# Patient Record
Sex: Male | Born: 1995 | Race: Black or African American | Hispanic: No | Marital: Single | Smoking: Current every day smoker
Health system: Southern US, Community
[De-identification: ages and names within clinical notes are randomized; demographics above are authoritative.]

---

## 2019-01-08 ENCOUNTER — Encounter (HOSPITAL_BASED_OUTPATIENT_CLINIC_OR_DEPARTMENT_OTHER): Payer: Self-pay | Admitting: *Deleted

## 2019-01-08 ENCOUNTER — Other Ambulatory Visit: Payer: Self-pay

## 2019-01-08 ENCOUNTER — Emergency Department (HOSPITAL_BASED_OUTPATIENT_CLINIC_OR_DEPARTMENT_OTHER)
Admission: EM | Admit: 2019-01-08 | Discharge: 2019-01-08 | Disposition: A | Discharge status: Home / Self Care | Payer: Self-pay | Attending: Emergency Medicine | Admitting: Emergency Medicine

## 2019-01-08 DIAGNOSIS — J029 Acute pharyngitis, unspecified: Secondary | ICD-10-CM | POA: Insufficient documentation

## 2019-01-08 DIAGNOSIS — T2125XA Burn of second degree of buttock, initial encounter: Secondary | ICD-10-CM | POA: Insufficient documentation

## 2019-01-08 DIAGNOSIS — Y929 Unspecified place or not applicable: Secondary | ICD-10-CM | POA: Insufficient documentation

## 2019-01-08 DIAGNOSIS — Z20828 Contact with and (suspected) exposure to other viral communicable diseases: Secondary | ICD-10-CM | POA: Insufficient documentation

## 2019-01-08 DIAGNOSIS — Y990 Civilian activity done for income or pay: Secondary | ICD-10-CM | POA: Insufficient documentation

## 2019-01-08 DIAGNOSIS — X19XXXA Contact with other heat and hot substances, initial encounter: Secondary | ICD-10-CM | POA: Insufficient documentation

## 2019-01-08 DIAGNOSIS — Y939 Activity, unspecified: Secondary | ICD-10-CM | POA: Insufficient documentation

## 2019-01-08 LAB — GROUP A STREP BY PCR: Group A Strep by PCR: NOT DETECTED

## 2019-01-08 MED ORDER — BENZONATATE 100 MG PO CAPS: 100.0000 mg | ORAL_CAPSULE | Freq: Three times a day (TID) | ORAL | 0 refills | Status: AC

## 2019-01-08 MED ORDER — SILVER SULFADIAZINE 1 % EX CREA
TOPICAL_CREAM | CUTANEOUS | Status: AC
  Administered 2019-01-08: 22:00:00 via TOPICAL
  Filled 2019-01-08: qty 85

## 2019-01-08 MED ORDER — SILVER SULFADIAZINE 1 % EX CREA
TOPICAL_CREAM | Freq: Once | CUTANEOUS | Status: AC
  Administered 2019-01-08: 22:00:00 via TOPICAL
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
## 2019-01-08 NOTE — Discharge Instructions (Addendum)
The strep test was negative.  Your symptoms are likely consistent with a viral illness. Viruses do not require or respond to antibiotics. Treatment is symptomatic care and it is important to note that these symptoms may last for 7-14 days.   Hand washing: Wash your hands throughout the day, but especially before and after touching the face, using the restroom, sneezing, coughing, or touching surfaces that have been coughed or sneezed upon. Hydration: Symptoms of most illnesses will be intensified and complicated by dehydration. Dehydration can also extend the duration of symptoms. Drink plenty of fluids and get plenty of rest. You should be drinking at least half a liter of water an hour to stay hydrated. Electrolyte drinks (ex. Gatorade, Powerade, Pedialyte) are also encouraged. You should be drinking enough fluids to make your urine light yellow, almost clear. If this is not the case, you are not drinking enough water. Please note that some of the treatments indicated below will not be effective if you are not adequately hydrated. Diet: Please concentrate on hydration, however, you may introduce food slowly.  Start with a clear liquid diet, progressed to a full liquid diet, and then bland solids as you are able. Pain or fever: Ibuprofen, Naproxen, or acetaminophen (generic for Tylenol) for pain or fever.  Antiinflammatory medications: Take 600 mg of ibuprofen every 6 hours or 440 mg (over the counter dose) to 500 mg (prescription dose) of naproxen every 12 hours for the next 3 days. After this time, these medications may be used as needed for pain. Take these medications with food to avoid upset stomach. Choose only one of these medications, do not take them together. Acetaminophen (generic for Tylenol): Should you continue to have additional pain while taking the ibuprofen or naproxen, you may add in acetaminophen as needed. Your daily total maximum amount of acetaminophen from all sources should be  limited to 4000mg /day for persons without liver problems, or 2000mg /day for those with liver problems. Cough: Use the benzonatate (generic for Tessalon) for cough.  Teas, warm liquids, broths, and honey can also help with cough. Zyrtec or Claritin: May add these medication daily to control underlying symptoms of congestion, sneezing, and other signs of allergies.  These medications are available over-the-counter. Generics: Cetirizine (generic for Zyrtec) and loratadine (generic for Claritin). Fluticasone: Use fluticasone (generic for Flonase), as directed, for nasal and sinus congestion.  This medication is available over-the-counter. Congestion: Plain guaifenesin (generic for plain Mucinex) may help relieve congestion. Saline sinus rinses and saline nasal sprays may also help relieve congestion. If you do not have high blood pressure, heart problems, or an allergy to such medications, you may also try phenylephrine or Sudafed. Sore throat: Warm liquids or Chloraseptic spray may help soothe a sore throat. Gargle twice a day with a salt water solution made from a half teaspoon of salt in a cup of warm water.  Follow up: Follow up with a primary care provider within the next two weeks should symptoms fail to resolve. Return: Return to the ED for significantly worsening symptoms, shortness of breath, persistent vomiting, large amounts of blood in stool, or any other major concerns.  For prescription assistance, may try using prescription discount sites or apps, such as goodrx.com  You have a test pending for COVID-19.  Results typically return within about 48 hours.  Be sure to check MyChart for updated results.  We recommend isolating yourself until results are received.  Patients who have symptoms consistent with COVID-19 should self isolated for: At  least 3 days (72 hours) have passed since recovery, defined as resolution of fever without the use of fever reducing medications and improvement in  respiratory symptoms (e.g., cough, shortness of breath), and At least 7 days have passed since symptoms first appeared.  If you have no symptoms, but your test returns positive, recommend isolating for at least 10 days.    Wound Care - General Wound Cleaning: Clean the wound and surrounding area gently with tap water and mild soap. Rinse well and blot dry. Do not scrub the wound, as this may cause the wound edges to come apart. You may shower, but avoid submerging the wound, such as with a bath or swimming.  Clean the wound daily to prevent infection.  Do not use cleaners such as hydrogen peroxide or alcohol.   Silvadene cream: Apply the cream twice daily for about the next 5 days.  This cream is used to help prevent infection and to encourage healing.  Scar reduction: Application of a topical antibiotic ointment, such as Neosporin, after the wound has begun to close and heal well can decrease scab formation and reduce scarring. After the wound has healed, application of ointments such as Aquaphor can also reduce scar formation.  The key to scar reduction is keeping the skin well hydrated and supple. Drinking plenty of water throughout the day (At least eight 8oz glasses of water a day) is essential to staying well hydrated.  Sun exposure: Keep the wound out of the sun. After the wound has healed, continue to protect it from the sun by wearing protective clothing or applying sunscreen.  Pain: You may use Tylenol, naproxen, or ibuprofen for pain. Antiinflammatory medications: Take 600 mg of ibuprofen every 6 hours or 440 mg (over the counter dose) to 500 mg (prescription dose) of naproxen every 12 hours for the next 3 days. After this time, these medications may be used as needed for pain. Take these medications with food to avoid upset stomach. Choose only one of these medications, do not take them together. Acetaminophen (generic for Tylenol): Should you continue to have additional pain while  taking the ibuprofen or naproxen, you may add in acetaminophen as needed. Your daily total maximum amount of acetaminophen from all sources should be limited to 4000mg /day for persons without liver problems, or 2000mg /day for those with liver problems.  Return: Return to the ED should signs of infection arise, such as spreading redness, puffiness/swelling, pus draining from the wound, severe increase in pain, fever over 100.30F, or any other major issues.  For prescription assistance, may try using prescription discount sites or apps, such as goodrx.com

## 2019-01-08 NOTE — ED Triage Notes (Signed)
Pt c/o sore throat x 1 day

## 2019-01-10 LAB — NOVEL CORONAVIRUS, NAA (HOSP ORDER, SEND-OUT TO REF LAB; TAT 18-24 HRS): SARS-CoV-2, NAA: NOT DETECTED

## 2019-05-29 ENCOUNTER — Other Ambulatory Visit: Payer: Self-pay

## 2019-05-29 ENCOUNTER — Emergency Department (HOSPITAL_COMMUNITY)
Admission: EM | Admit: 2019-05-29 | Discharge: 2019-05-29 | Discharge status: Against Medical Advice | Payer: Medicaid Other | Attending: Emergency Medicine | Admitting: Emergency Medicine

## 2019-05-29 ENCOUNTER — Encounter (HOSPITAL_COMMUNITY): Payer: Self-pay | Admitting: Emergency Medicine

## 2019-05-29 DIAGNOSIS — M25561 Pain in right knee: Secondary | ICD-10-CM

## 2019-05-29 DIAGNOSIS — Z5320 Procedure and treatment not carried out because of patient's decision for unspecified reasons: Secondary | ICD-10-CM | POA: Insufficient documentation

## 2019-05-29 DIAGNOSIS — R509 Fever, unspecified: Secondary | ICD-10-CM | POA: Insufficient documentation

## 2019-05-29 MED ORDER — IBUPROFEN 800 MG PO TABS: 800.0000 mg | ORAL_TABLET | Freq: Once | ORAL | Status: DC
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
## 2019-05-29 NOTE — ED Notes (Signed)
Patient states that his employer called him to tell him that he is not supposed to be at the ER, but instead to go to [facility].  PT says he will be leaving to go to [facility].  PT is on the phone making arrangement to leave
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
## 2019-05-29 NOTE — Discharge Instructions (Addendum)
Recommend work up for fever with right knee pain as follows- CBC, BMP, UA, COVID test, XR knee. You have decided to leave against medical advice to obtain care at an urgent care. Recommend return to ER for further evaluation if needed, contact your employer for further assistance.

## 2019-06-03 ENCOUNTER — Emergency Department (HOSPITAL_COMMUNITY)
Admission: EM | Admit: 2019-06-03 | Discharge: 2019-06-03 | Disposition: A | Discharge status: Home / Self Care | Payer: No Typology Code available for payment source | Attending: Emergency Medicine | Admitting: Emergency Medicine

## 2019-06-03 ENCOUNTER — Emergency Department (HOSPITAL_COMMUNITY): Payer: No Typology Code available for payment source

## 2019-06-03 ENCOUNTER — Other Ambulatory Visit: Payer: Self-pay

## 2019-06-03 ENCOUNTER — Encounter (HOSPITAL_COMMUNITY): Payer: Self-pay | Admitting: Emergency Medicine

## 2019-06-03 DIAGNOSIS — S40011A Contusion of right shoulder, initial encounter: Secondary | ICD-10-CM | POA: Diagnosis not present

## 2019-06-03 DIAGNOSIS — T148XXA Other injury of unspecified body region, initial encounter: Secondary | ICD-10-CM

## 2019-06-03 DIAGNOSIS — S46819A Strain of other muscles, fascia and tendons at shoulder and upper arm level, unspecified arm, initial encounter: Secondary | ICD-10-CM | POA: Diagnosis not present

## 2019-06-03 DIAGNOSIS — Y92410 Unspecified street and highway as the place of occurrence of the external cause: Secondary | ICD-10-CM | POA: Insufficient documentation

## 2019-06-03 DIAGNOSIS — Y939 Activity, unspecified: Secondary | ICD-10-CM | POA: Diagnosis not present

## 2019-06-03 DIAGNOSIS — Y999 Unspecified external cause status: Secondary | ICD-10-CM | POA: Diagnosis not present

## 2019-06-03 DIAGNOSIS — S0081XA Abrasion of other part of head, initial encounter: Secondary | ICD-10-CM | POA: Diagnosis not present

## 2019-06-03 DIAGNOSIS — S4991XA Unspecified injury of right shoulder and upper arm, initial encounter: Secondary | ICD-10-CM | POA: Diagnosis present

## 2019-06-03 MED ORDER — IBUPROFEN 400 MG PO TABS
800.0000 mg | ORAL_TABLET | Freq: Once | ORAL | Status: AC
  Administered 2019-06-03: 15:00:00 800 mg via ORAL
  Filled 2019-06-03: qty 2

## 2019-06-03 NOTE — ED Notes (Signed)
Patient awake alert, return from xray,color pink,chest clear,good aeration,no retractions 3 plus pulses ,<2sec refill,tolerated po med, observing/awaiting xray results, provider at bedside

## 2019-06-03 NOTE — Discharge Instructions (Addendum)
Use Tylenol and Motrin and ice as needed for pain. Return for new or worsening symptoms including numbness, weakness, bowel or bladder changes, persistent vomiting, passing out or new concerns.
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
## 2022-03-19 ENCOUNTER — Encounter (HOSPITAL_COMMUNITY): Payer: Self-pay

## 2022-03-19 ENCOUNTER — Emergency Department (HOSPITAL_COMMUNITY)
Admission: EM | Admit: 2022-03-19 | Discharge: 2022-03-19 | Disposition: A | Discharge status: Home / Self Care | Payer: 59 | Attending: Emergency Medicine | Admitting: Emergency Medicine

## 2022-03-19 ENCOUNTER — Other Ambulatory Visit: Payer: Self-pay

## 2022-03-19 DIAGNOSIS — N51 Disorders of male genital organs in diseases classified elsewhere: Secondary | ICD-10-CM | POA: Diagnosis not present

## 2022-03-19 DIAGNOSIS — R369 Urethral discharge, unspecified: Secondary | ICD-10-CM | POA: Diagnosis not present

## 2022-03-19 DIAGNOSIS — N509 Disorder of male genital organs, unspecified: Secondary | ICD-10-CM

## 2022-03-19 DIAGNOSIS — H00011 Hordeolum externum right upper eyelid: Secondary | ICD-10-CM | POA: Diagnosis not present

## 2022-03-19 DIAGNOSIS — H0289 Other specified disorders of eyelid: Secondary | ICD-10-CM | POA: Diagnosis present

## 2022-03-19 LAB — HIV ANTIBODY (ROUTINE TESTING W REFLEX): HIV Screen 4th Generation wRfx: NONREACTIVE

## 2022-03-19 MED ORDER — CEFTRIAXONE SODIUM 500 MG IJ SOLR
500.0000 mg | Freq: Once | INTRAMUSCULAR | Status: AC
  Administered 2022-03-19: 500 mg via INTRAMUSCULAR
  Filled 2022-03-19: qty 500

## 2022-03-19 MED ORDER — DOXYCYCLINE HYCLATE 100 MG PO CAPS: 100.0000 mg | ORAL_CAPSULE | Freq: Two times a day (BID) | ORAL | 0 refills | Status: DC

## 2022-03-19 MED ORDER — DOXYCYCLINE HYCLATE 100 MG PO TABS
100.0000 mg | ORAL_TABLET | Freq: Once | ORAL | Status: AC
  Administered 2022-03-19: 100 mg via ORAL
  Filled 2022-03-19: qty 1
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
## 2022-03-19 NOTE — Discharge Instructions (Addendum)
Do not have intercourse until all of your symptoms are resolved and you have completed the antibiotics.  You are being referred to the infectious diseases center.  For your right eyelid swelling you are also being referred to a eye specialist.

## 2022-03-19 NOTE — ED Provider Notes (Signed)
[facility] EMERGENCY DEPARTMENT Provider Note   CSN: [id number] Arrival date & time: 03/19/22  1231     History  Chief Complaint  Patient presents with   Exposure to STD   Eye Pain    Angel Peck is a 26 y.o. male.  HPI 26 year old male presents with 2 complaints.  One is of right eyelid swelling.  He states his eyelid as well and it comes and goes.  He feels like is related to when his blood pressure goes up.  No current blurry vision.  Is uncomfortable.  He also complains of concern for STI.  He states he has a history of herpes, but has not had any symptoms since original episode.  He is concern for recurrent herpes.  He states that over the last week or so, 1 day after having intercourse, he developed lesions to his penis that then seem to break open and drain.  He states that seem like boils.  He has had urethral discharge that he states that is actually resolved.  He has never had dysuria, testicular pain or penile pain.  Home Medications Prior to Admission medications   Medication Sig Start Date End Date Taking? Authorizing Provider  doxycycline (VIBRAMYCIN) 100 MG capsule Take 1 capsule (100 mg total) by mouth 2 (two) times daily. 03/19/22  Yes Pricilla Loveless, MD  benzonatate (TESSALON) 100 MG capsule Take 1 capsule (100 mg total) by mouth every 8 (eight) hours. 01/08/19   Joy, Shawn C, PA-C      Allergies    Amoxil [amoxicillin] and Penicillins    Review of Systems   Review of Systems  HENT:  Positive for facial swelling.   Genitourinary:  Positive for penile discharge. Negative for dysuria, penile pain, scrotal swelling and testicular pain.  Skin:  Positive for rash.    Physical Exam Updated Vital Signs BP (!) 145/97 (BP Location: Right Arm)   Pulse 71   Temp 98.1 F (36.7 C) (Oral)   Resp 18   Ht 6\' 1"  (1.854 m)   Wt 95.3 kg   SpO2 98%   BMI 27.71 kg/m  Physical Exam Vitals and nursing note reviewed. Exam conducted with a chaperone  present.  Constitutional:      Appearance: He is well-developed.  HENT:     Head: Normocephalic and atraumatic.  Eyes:   Pulmonary:     Effort: Pulmonary effort is normal.     Breath sounds: Normal breath sounds.  Abdominal:     Palpations: Abdomen is soft.     Tenderness: There is no abdominal tenderness.  Genitourinary:   Skin:    General: Skin is warm and dry.  Neurological:     Mental Status: He is alert.     ED Results / Procedures / Treatments   Labs (all labs ordered are listed, but only abnormal results are displayed) Labs Reviewed  RPR  HIV ANTIBODY (ROUTINE TESTING W REFLEX)  GC/CHLAMYDIA PROBE AMP ([place]) NOT AT [facility]    EKG None  Radiology No results found.  Procedures Procedures    Medications Ordered in ED Medications  cefTRIAXone (ROCEPHIN) injection 500 mg (has no administration in time range)  doxycycline (VIBRA-TABS) tablet 100 mg (has no administration in time range)    ED Course/ Medical Decision Making/ A&P                           Medical Decision Making Risk Prescription drug management.  Patient will be treated for potential STI after discussion with the patient.  He states that he has no pain or burning and so I think a recurrent herpes outbreak is pretty unlikely.  It is possible he did have a herpes outbreak and then the lesions popped but he never had any significant pain so I doubt it.  Otherwise with painless lesions I think it is reasonable to check for HIV and syphilis.  He is concerned about STI and while he is not having active discharge, will treat with Rocephin and doxycycline.  He does state he has a penicillin allergy with some localized redness/rash, but I think ceftriaxone should be okay.  He declined urethral swab and instead was given a urine sample.  Otherwise, from an eyelid perspective, it is not overtly a stye or swollen and I think he can follow-up with ophthalmology given the waxing and waning symptoms for  a while.  No visual complaints currently.        Final Clinical Impression(s) / ED Diagnoses Final diagnoses:  Genital disease, male  Hordeolum externum of right upper eyelid    Rx / DC Orders ED Discharge Orders          Ordered    doxycycline (VIBRAMYCIN) 100 MG capsule  2 times daily        03/19/22 1452              Sherwood Gambler, MD 03/19/22 1507

## 2022-03-19 NOTE — ED Provider Triage Note (Signed)
Emergency Medicine Provider Triage Evaluation Note  Angel Peck , a 26 y.o. male  was evaluated in triage.  Presenting with 2 complaints.  First is a bump on his right eyelid that initially appeared 2 months ago.  Continues to fluctuate in size, usually increased size and pain with agitation or high blood pressure.  No discharge from the area.  Second complaint is dysuria with penile discharge, possible STD exposure.  Also reports known Hx of herpes type I, states he believes he is having an active outbreak of this as well in the genital region.  Denies fever, joint pain, N/V/D.  Review of Systems  Positive:  Negative: See above  Physical Exam  BP (!) 145/97 (BP Location: Right Arm)   Pulse 71   Temp 98.1 F (36.7 C) (Oral)   Resp 18   Ht 6\' 1"  (1.854 m)   Wt 95.3 kg   SpO2 98%   BMI 27.71 kg/m  Gen:   Awake, no distress   Resp:  Normal effort  MSK:   Moves extremities without difficulty  Other:  1 small mildly tender bump on the right upper eyelid.  Without evidence of drainage.  Gaze appropriately aligned.  PERRLA.  Medical Decision Making  Medically screening exam initiated at 1:45 PM.  Appropriate orders placed.  Angel Peck was informed that the remainder of the evaluation will be completed by another provider, this initial triage assessment does not replace that evaluation, and the importance of remaining in the ED until their evaluation is complete.     Candelaria Stagers, PA-C 03/19/22 1347

## 2022-03-19 NOTE — ED Notes (Signed)
This RN entered room with PA. Patient states that he does not want a male doctor and requesting a male.

## 2022-03-19 NOTE — ED Notes (Addendum)
RN explained to patient that MD wanted to observe him for 30 minutes after medication but patient stated he could not and he had to leave. RN educated patient on symptoms to watch for and return to the ED immediately if an allergic reaction stated to happen. Patient agreed and RN discharged him.

## 2022-03-20 LAB — RPR: RPR Ser Ql: NONREACTIVE

## 2022-10-27 ENCOUNTER — Emergency Department (HOSPITAL_COMMUNITY)
Admission: EM | Admit: 2022-10-27 | Discharge: 2022-10-27 | Disposition: A | Discharge status: Home / Self Care | Payer: 59 | Attending: Emergency Medicine | Admitting: Emergency Medicine

## 2022-10-27 ENCOUNTER — Other Ambulatory Visit: Payer: Self-pay

## 2022-10-27 ENCOUNTER — Encounter (HOSPITAL_COMMUNITY): Payer: Self-pay | Admitting: *Deleted

## 2022-10-27 DIAGNOSIS — N341 Nonspecific urethritis: Secondary | ICD-10-CM | POA: Diagnosis not present

## 2022-10-27 DIAGNOSIS — N342 Other urethritis: Secondary | ICD-10-CM

## 2022-10-27 DIAGNOSIS — R369 Urethral discharge, unspecified: Secondary | ICD-10-CM | POA: Diagnosis present

## 2022-10-27 LAB — URINALYSIS, ROUTINE W REFLEX MICROSCOPIC
Bacteria, UA: NONE SEEN
Bilirubin Urine: NEGATIVE
Glucose, UA: NEGATIVE mg/dL
Hgb urine dipstick: NEGATIVE
Ketones, ur: NEGATIVE mg/dL
Nitrite: NEGATIVE
Protein, ur: NEGATIVE mg/dL
Specific Gravity, Urine: 1.023 (ref 1.005–1.030)
pH: 7 (ref 5.0–8.0)

## 2022-10-27 MED ORDER — DOXYCYCLINE HYCLATE 100 MG PO TABS: 100.0000 mg | ORAL_TABLET | Freq: Two times a day (BID) | ORAL | 0 refills | Status: AC

## 2022-10-27 MED ORDER — CEFTRIAXONE SODIUM 500 MG IJ SOLR
500.0000 mg | Freq: Once | INTRAMUSCULAR | Status: AC
  Administered 2022-10-27: 500 mg via INTRAMUSCULAR
  Filled 2022-10-27: qty 500

## 2022-10-27 MED ORDER — LIDOCAINE HCL (PF) 1 % IJ SOLN
1.0000 mL | Freq: Once | INTRAMUSCULAR | Status: AC
  Administered 2022-10-27: 1 mL
  Filled 2022-10-27: qty 5

## 2022-10-27 MED ORDER — DOXYCYCLINE HYCLATE 100 MG PO TABS: 100.0000 mg | ORAL_TABLET | Freq: Two times a day (BID) | ORAL | 0 refills | Status: DC

## 2022-10-27 NOTE — ED Notes (Signed)
Pt ambulated from the ed with a steady gait. Pt verbalized understanding of discharge instructions.

## 2022-10-27 NOTE — ED Triage Notes (Signed)
States he recently had unprotected sex and had penile discharge this am.

## 2022-10-27 NOTE — Discharge Instructions (Signed)
You were evaluated today for penile discharge concerning for possible STI exposure.  I have prescribed a medication called doxycycline.  You may monitor your STI test results on MyChart.  If testing is negative you may stop the antibiotic.  If positive you need to complete the full course of antibiotics and refrain from sexual activity until antibiotics are complete.  You should also notify recent partners so they may get treated as needed.

## 2022-10-27 NOTE — ED Provider Notes (Signed)
[place] EMERGENCY DEPARTMENT AT [facility] Provider Note   CSN: [id number] Arrival date & time: 10/27/22  1113     History  Chief Complaint  Patient presents with   Exposure to STD    Angel Peck is a 27 y.o. male.  Patient presents to the ED with penile discharge, dysuria and nausea for 2 days. Patient states he had unprotected sex 2 days ago and reports having a history of gonorrhea "2-3 months ago". Patient states he received a shot of ceftriaxone in the ED, but never picked up the prescription for doxycycline because "it's too expensive". Patient says his sxs today are less severe compared to "last time". Patient endorses fatigue and denies fever, chills, vomiting, abdominal pain, back pain, testicular swelling/pain, pruritus, lesions in mouth or penile area.   HPI     Home Medications Prior to Admission medications   Medication Sig Start Date End Date Taking? Authorizing Provider  benzonatate (TESSALON) 100 MG capsule Take 1 capsule (100 mg total) by mouth every 8 (eight) hours. 01/08/19   Joy, Shawn C, PA-C  doxycycline (VIBRA-TABS) 100 MG tablet Take 1 tablet (100 mg total) by mouth 2 (two) times daily. 10/27/22   Darrick Grinder, PA-C      Allergies    Amoxil [amoxicillin] and Penicillins    Review of Systems   Review of Systems  Physical Exam Updated Vital Signs BP (!) 140/88 (BP Location: Left Arm)   Pulse 81   Temp 98.4 F (36.9 C) (Oral)   Resp 14   Ht 6\' 1"  (1.854 m)   Wt 104.3 kg   SpO2 98%   BMI 30.34 kg/m  Physical Exam HENT:     Head: Normocephalic and atraumatic.  Eyes:     Conjunctiva/sclera: Conjunctivae normal.  Pulmonary:     Effort: Pulmonary effort is normal. No respiratory distress.  Genitourinary:    Penis: Discharge present.   Musculoskeletal:        General: No signs of injury.     Cervical back: Normal range of motion.  Skin:    General: Skin is dry.  Neurological:     Mental Status: He is alert.  Psychiatric:         Speech: Speech normal.        Behavior: Behavior normal.     ED Results / Procedures / Treatments   Labs (all labs ordered are listed, but only abnormal results are displayed) Labs Reviewed  URINALYSIS, ROUTINE W REFLEX MICROSCOPIC  GC/CHLAMYDIA PROBE AMP ([place]) NOT AT [facility]    EKG None  Radiology No results found.  Procedures Procedures    Medications Ordered in ED Medications  cefTRIAXone (ROCEPHIN) injection 500 mg (has no administration in time range)  lidocaine (PF) (XYLOCAINE) 1 % injection 1-2.1 mL (has no administration in time range)    ED Course/ Medical Decision Making/ A&P                             Medical Decision Making Amount and/or Complexity of Data Reviewed Labs: ordered.   Patient presents to the emergency department the chief complaint of penile discharge with dysuria.  Differential diagnosis includes but is not limited to gonorrhea, chlamydia, others  I ordered a UA and a gonorrhea chlamydia probe are pending at this time  The patient was administered Rocephin and given a prescription for doxycycline for STI coverage.  Patient's presentation does seem consistent with STI.  Patient with history of gonorrhea infection and never completed his previous course of doxycycline.  Patient does endorse recent unprotected sex.  Patient will follow results on MyChart and if testing is negative patient may discontinue the doxycycline.  Discharge home.        Final Clinical Impression(s) / ED Diagnoses Final diagnoses:  Penile discharge  Urethritis    Rx / DC Orders ED Discharge Orders          Ordered    doxycycline (VIBRA-TABS) 100 MG tablet  2 times daily,   Status:  Discontinued        10/27/22 1248    doxycycline (VIBRA-TABS) 100 MG tablet  2 times daily        10/27/22 1313              Pamala Duffel 10/27/22 1314    Terald Sleeper, MD 10/27/22 [phone]

## 2023-01-17 ENCOUNTER — Other Ambulatory Visit: Payer: Self-pay

## 2023-01-17 ENCOUNTER — Emergency Department (HOSPITAL_COMMUNITY)
Admission: EM | Admit: 2023-01-17 | Discharge: 2023-01-18 | Disposition: A | Discharge status: Home / Self Care | Payer: 59 | Attending: Emergency Medicine | Admitting: Emergency Medicine

## 2023-01-17 DIAGNOSIS — T40711A Poisoning by cannabis, accidental (unintentional), initial encounter: Secondary | ICD-10-CM | POA: Insufficient documentation

## 2023-01-17 DIAGNOSIS — T50901A Poisoning by unspecified drugs, medicaments and biological substances, accidental (unintentional), initial encounter: Secondary | ICD-10-CM

## 2023-01-17 DIAGNOSIS — E876 Hypokalemia: Secondary | ICD-10-CM | POA: Insufficient documentation

## 2023-01-17 NOTE — ED Notes (Signed)
Pt hypoxic to 88% on room air. Was placed in triage room 5 on 2 L nasal cannula. SpO2 improved to 95%. Awaiting next room available
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
## 2023-01-18 LAB — COMPREHENSIVE METABOLIC PANEL WITH GFR
ALT: 69 U/L — ABNORMAL HIGH (ref 0–44)
AST: 38 U/L (ref 15–41)
Albumin: 4.1 g/dL (ref 3.5–5.0)
Alkaline Phosphatase: 72 U/L (ref 38–126)
Anion gap: 10 (ref 5–15)
BUN: 12 mg/dL (ref 6–20)
CO2: 23 mmol/L (ref 22–32)
Calcium: 8.6 mg/dL — ABNORMAL LOW (ref 8.9–10.3)
Chloride: 105 mmol/L (ref 98–111)
Creatinine, Ser: 1.21 mg/dL (ref 0.61–1.24)
GFR, Estimated: >60 mL/min
Glucose, Bld: 160 mg/dL — ABNORMAL HIGH (ref 70–99)
Potassium: 3.3 mmol/L — ABNORMAL LOW (ref 3.5–5.1)
Sodium: 138 mmol/L (ref 135–145)
Total Bilirubin: 0.7 mg/dL (ref 0.3–1.2)
Total Protein: 7.3 g/dL (ref 6.5–8.1)

## 2023-01-18 LAB — CBC
HCT: 43.7 % (ref 39.0–52.0)
Hemoglobin: 14.2 g/dL (ref 13.0–17.0)
MCH: 28.7 pg (ref 26.0–34.0)
MCHC: 32.5 g/dL (ref 30.0–36.0)
MCV: 88.5 fL (ref 80.0–100.0)
Platelets: 178 10*3/uL (ref 150–400)
RBC: 4.94 MIL/uL (ref 4.22–5.81)
RDW: 12.7 % (ref 11.5–15.5)
WBC: 5.4 10*3/uL (ref 4.0–10.5)
nRBC: 0 % (ref 0.0–0.2)

## 2023-01-18 LAB — SALICYLATE LEVEL: Salicylate Lvl: <7 mg/dL — ABNORMAL LOW (ref 7.0–30.0)

## 2023-01-18 LAB — ETHANOL: Alcohol, Ethyl (B): <10 mg/dL

## 2023-01-18 LAB — ACETAMINOPHEN LEVEL: Acetaminophen (Tylenol), Serum: <10 ug/mL — ABNORMAL LOW (ref 10–30)

## 2023-01-18 LAB — CBG MONITORING, ED: Glucose-Capillary: 145 mg/dL — ABNORMAL HIGH (ref 70–99)

## 2023-01-18 MED ORDER — ONDANSETRON HCL 4 MG/2ML IJ SOLN
4.0000 mg | Freq: Once | INTRAMUSCULAR | Status: AC
  Administered 2023-01-18: 4 mg via INTRAVENOUS
  Filled 2023-01-18: qty 2

## 2023-01-18 MED ORDER — NALOXONE HCL 2 MG/2ML IJ SOSY
4.0000 mg | PREFILLED_SYRINGE | Freq: Once | INTRAMUSCULAR | Status: AC
  Administered 2023-01-18: 4 mg via INTRAVENOUS
  Filled 2023-01-18: qty 4

## 2023-01-18 NOTE — ED Provider Notes (Signed)
[place] EMERGENCY DEPARTMENT AT [facility] Provider Note   CSN: [id number] Arrival date & time: 01/17/23  2325     History  Chief Complaint  Patient presents with   Drug Overdose   Hypoxia    Angel Peck is a 27 y.o. male.  The history is provided by the patient and the EMS personnel. The history is limited by the condition of the patient.  Drug Overdose This is a new problem. The current episode started less than 1 hour ago. The problem occurs constantly. Pertinent negatives include no chest pain, no abdominal pain, no headaches and no shortness of breath. Nothing aggravates the symptoms. Nothing relieves the symptoms. Treatments tried: narcan. The treatment provided moderate relief.  Smoked marijuana from a new dealer to get high prior to arrival and was not responsive with pinpoint pupils and given narcan with arousal by ems.       Home Medications Prior to Admission medications   Medication Sig Start Date End Date Taking? Authorizing Provider  benzonatate (TESSALON) 100 MG capsule Take 1 capsule (100 mg total) by mouth every 8 (eight) hours. Patient not taking: Reported on 01/18/2023 01/08/19   Joy, Hillard Danker, PA-C  doxycycline (VIBRA-TABS) 100 MG tablet Take 1 tablet (100 mg total) by mouth 2 (two) times daily. Patient not taking: Reported on 01/18/2023 10/27/22   Darrick Grinder, PA-C      Allergies    Amoxil [amoxicillin] and Penicillins    Review of Systems   Review of Systems  Constitutional:  Negative for fever.  Respiratory:  Negative for shortness of breath.   Cardiovascular:  Negative for chest pain.  Gastrointestinal:  Negative for abdominal pain.  Neurological:  Negative for headaches.  Psychiatric/Behavioral:  Negative for self-injury, sleep disturbance and suicidal ideas. The patient is not nervous/anxious.   All other systems reviewed and are negative.   Physical Exam Updated Vital Signs BP (!) 143/77   Pulse 84   Temp 98.7 F (37.1  C) (Oral)   Resp 15   Ht 6\' 1"  (1.854 m)   Wt 104.3 kg   SpO2 96%   BMI 30.34 kg/m  Physical Exam Vitals and nursing note reviewed. Exam conducted with a chaperone present.  Constitutional:      General: He is not in acute distress.    Appearance: He is well-developed. He is not diaphoretic.  HENT:     Head: Normocephalic and atraumatic.  Eyes:     Conjunctiva/sclera: Conjunctivae normal.     Comments: Pinpoint pupils prior to narcan here   Cardiovascular:     Rate and Rhythm: Normal rate and regular rhythm.     Pulses: Normal pulses.     Heart sounds: Normal heart sounds.  Pulmonary:     Effort: Pulmonary effort is normal.     Breath sounds: Normal breath sounds. No wheezing or rales.  Abdominal:     General: Bowel sounds are normal.     Palpations: Abdomen is soft.     Tenderness: There is no abdominal tenderness. There is no guarding or rebound.  Musculoskeletal:        General: Normal range of motion.     Cervical back: Normal range of motion and neck supple.  Skin:    General: Skin is warm and dry.  Neurological:     Mental Status: He is alert and oriented to person, place, and time.     ED Results / Procedures / Treatments   Labs (all labs ordered  are listed, but only abnormal results are displayed) Results for orders placed or performed during the hospital encounter of 01/17/23  Comprehensive metabolic panel  Result Value Ref Range   Sodium 138 135 - 145 mmol/L   Potassium 3.3 (L) 3.5 - 5.1 mmol/L   Chloride 105 98 - 111 mmol/L   CO2 23 22 - 32 mmol/L   Glucose, Bld 160 (H) 70 - 99 mg/dL   BUN 12 6 - 20 mg/dL   Creatinine, Ser 1.61 0.61 - 1.24 mg/dL   Calcium 8.6 (L) 8.9 - 10.3 mg/dL   Total Protein 7.3 6.5 - 8.1 g/dL   Albumin 4.1 3.5 - 5.0 g/dL   AST 38 15 - 41 U/L   ALT 69 (H) 0 - 44 U/L   Alkaline Phosphatase 72 38 - 126 U/L   Total Bilirubin 0.7 0.3 - 1.2 mg/dL   GFR, Estimated >09 >60 mL/min   Anion gap 10 5 - 15  Ethanol  Result Value Ref  Range   Alcohol, Ethyl (B) <10 <10 mg/dL  Salicylate level  Result Value Ref Range   Salicylate Lvl <7.0 (L) 7.0 - 30.0 mg/dL  Acetaminophen level  Result Value Ref Range   Acetaminophen (Tylenol), Serum <10 (L) 10 - 30 ug/mL  cbc  Result Value Ref Range   WBC 5.4 4.0 - 10.5 K/uL   RBC 4.94 4.22 - 5.81 MIL/uL   Hemoglobin 14.2 13.0 - 17.0 g/dL   HCT 45.4 09.8 - 11.9 %   MCV 88.5 80.0 - 100.0 fL   MCH 28.7 26.0 - 34.0 pg   MCHC 32.5 30.0 - 36.0 g/dL   RDW 14.7 82.9 - 56.2 %   Platelets 178 150 - 400 K/uL   nRBC 0.0 0.0 - 0.2 %  CBG monitoring, ED  Result Value Ref Range   Glucose-Capillary 145 (H) 70 - 99 mg/dL   No results found.  Radiology No results found.  Procedures Procedures    Medications Ordered in ED Medications  naloxone (NARCAN) injection 4 mg (4 mg Intravenous Given 01/18/23 0044)  ondansetron (ZOFRAN) injection 4 mg (4 mg Intravenous Given 01/18/23 0044)    ED Course/ Medical Decision Making/ A&P                                 Medical Decision Making Patient smoked marijuana from [place] and was not responsive and given narcan   Amount and/or Complexity of Data Reviewed Independent Historian: EMS    Details: See above  External Data Reviewed: notes.    Details: Previous notes reviewed  Labs: ordered.    Details: Negative tylenol and salicylate levels. Normal white count 5.4, normal hemoglobin 14.2, normal platelets.  Normal sodium 138, potassium slight low 3.3, normal creatinine 1.21 normal LFTs  Risk Prescription drug management. Risk Details: Awake and alert without complaints, observed in the ED.  Advised much of the marijuana in the area is laced with drugs like fentanyl and xylazine and not to use drugs to avoid this.  Verbalizes understanding stable for discharge.      Final Clinical Impression(s) / ED Diagnoses Final diagnoses:  Accidental overdose, initial encounter   Return for intractable cough, coughing up blood, fevers > 100.4  unrelieved by medication, shortness of breath, intractable vomiting, chest pain, shortness of breath, weakness, numbness, changes in speech, facial asymmetry, abdominal pain, passing out, Inability to tolerate liquids or food, cough, altered mental status  or any concerns. No signs of systemic illness or infection. The patient is nontoxic-appearing on exam and vital signs are within normal limits.  I have reviewed the triage vital signs and the nursing notes. Pertinent labs & imaging results that were available during my care of the patient were reviewed by me and considered in my medical decision making (see chart for details). After history, exam, and medical workup I feel the patient has been appropriately medically screened and is safe for discharge home. Pertinent diagnoses were discussed with the patient. Patient was given return precautions.  Rx / DC Orders ED Discharge Orders     None         Satia Winger, MD 01/18/23 [phone]

## 2023-01-18 NOTE — Discharge Instructions (Signed)
Do not use drugs of any kind

## 2023-02-22 ENCOUNTER — Emergency Department (HOSPITAL_COMMUNITY)
Admission: EM | Admit: 2023-02-22 | Discharge: 2023-02-23 | Disposition: A | Discharge status: Home / Self Care | Payer: 59 | Attending: Student | Admitting: Student

## 2023-02-22 ENCOUNTER — Emergency Department (HOSPITAL_COMMUNITY): Payer: 59

## 2023-02-22 ENCOUNTER — Other Ambulatory Visit: Payer: Self-pay

## 2023-02-22 DIAGNOSIS — F17210 Nicotine dependence, cigarettes, uncomplicated: Secondary | ICD-10-CM | POA: Insufficient documentation

## 2023-02-22 DIAGNOSIS — S60221A Contusion of right hand, initial encounter: Secondary | ICD-10-CM | POA: Insufficient documentation

## 2023-02-22 DIAGNOSIS — R0600 Dyspnea, unspecified: Secondary | ICD-10-CM | POA: Insufficient documentation

## 2023-02-22 DIAGNOSIS — S6991XA Unspecified injury of right wrist, hand and finger(s), initial encounter: Secondary | ICD-10-CM | POA: Diagnosis present
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
## 2023-07-04 ENCOUNTER — Other Ambulatory Visit: Payer: Self-pay

## 2023-07-04 ENCOUNTER — Emergency Department (HOSPITAL_COMMUNITY)
Admission: EM | Admit: 2023-07-04 | Discharge: 2023-07-04 | Disposition: A | Discharge status: Home / Self Care | Payer: Self-pay | Attending: Emergency Medicine | Admitting: Emergency Medicine

## 2023-07-04 DIAGNOSIS — R519 Headache, unspecified: Secondary | ICD-10-CM | POA: Insufficient documentation

## 2023-07-04 LAB — CBC WITH DIFFERENTIAL/PLATELET
Abs Immature Granulocytes: 0.01 10*3/uL (ref 0.00–0.07)
Basophils Absolute: 0 10*3/uL (ref 0.0–0.1)
Basophils Relative: 1 %
Eosinophils Absolute: 0 10*3/uL (ref 0.0–0.5)
Eosinophils Relative: 1 %
HCT: 43.4 % (ref 39.0–52.0)
Hemoglobin: 13.9 g/dL (ref 13.0–17.0)
Immature Granulocytes: 0 %
Lymphocytes Relative: 39 %
Lymphs Abs: 1.3 10*3/uL (ref 0.7–4.0)
MCH: 28.1 pg (ref 26.0–34.0)
MCHC: 32 g/dL (ref 30.0–36.0)
MCV: 87.9 fL (ref 80.0–100.0)
Monocytes Absolute: 0.6 10*3/uL (ref 0.1–1.0)
Monocytes Relative: 18 %
Neutro Abs: 1.4 10*3/uL — ABNORMAL LOW (ref 1.7–7.7)
Neutrophils Relative %: 41 %
Platelets: 209 10*3/uL (ref 150–400)
RBC: 4.94 MIL/uL (ref 4.22–5.81)
RDW: 13.1 % (ref 11.5–15.5)
WBC: 3.3 10*3/uL — ABNORMAL LOW (ref 4.0–10.5)
nRBC: 0 % (ref 0.0–0.2)

## 2023-07-04 LAB — RESP PANEL BY RT-PCR (RSV, FLU A&B, COVID)  RVPGX2
Influenza A by PCR: NEGATIVE
Influenza B by PCR: NEGATIVE
Resp Syncytial Virus by PCR: NEGATIVE
SARS Coronavirus 2 by RT PCR: NEGATIVE

## 2023-07-04 LAB — BASIC METABOLIC PANEL
Anion gap: 9 (ref 5–15)
BUN: 12 mg/dL (ref 6–20)
CO2: 24 mmol/L (ref 22–32)
Calcium: 9.2 mg/dL (ref 8.9–10.3)
Chloride: 106 mmol/L (ref 98–111)
Creatinine, Ser: 1.08 mg/dL (ref 0.61–1.24)
GFR, Estimated: >60 mL/min (ref >60)
Glucose, Bld: 99 mg/dL (ref 70–99)
Potassium: 4.5 mmol/L (ref 3.5–5.1)
Sodium: 139 mmol/L (ref 135–145)

## 2023-07-04 MED ORDER — KETOROLAC TROMETHAMINE 15 MG/ML IJ SOLN
15.0000 mg | Freq: Once | INTRAMUSCULAR | Status: AC
  Administered 2023-07-04: 15 mg via INTRAMUSCULAR
  Filled 2023-07-04: qty 1

## 2023-07-04 MED ORDER — PROCHLORPERAZINE EDISYLATE 10 MG/2ML IJ SOLN
10.0000 mg | Freq: Once | INTRAMUSCULAR | Status: AC
  Administered 2023-07-04: 10 mg via INTRAMUSCULAR
  Filled 2023-07-04: qty 2

## 2023-07-04 MED ORDER — DIPHENHYDRAMINE HCL 25 MG PO CAPS
25.0000 mg | ORAL_CAPSULE | Freq: Once | ORAL | Status: AC
  Administered 2023-07-04: 25 mg via ORAL
  Filled 2023-07-04: qty 1

## 2023-07-04 NOTE — ED Provider Triage Note (Signed)
 Emergency Medicine Provider Triage Evaluation Note  Mattox Schorr , a 28 y.o. male  was evaluated in triage.  Pt complains of feeling "worn out", has some fatigue, myalgias, intermittent headaches, some cough.  Mild intermittent chest pain.  Has been walking a lot to and from work and feels worn out..  Review of Systems  Positive: Myalgias, subjective fever, , fatigue Negative: Shortness of breath  Physical Exam  BP 131/86 (BP Location: Right Arm)   Pulse 70   Temp 97.6 F (36.4 C)   Resp 16   SpO2 100%  Gen:   Awake, no distress    Resp:  Normal effort   MSK:   Moves extremities without difficulty   Other:     Medical Decision Making  Medically screening exam initiated at 10:46 AM.  Appropriate orders placed.  Yerick Eggebrecht was informed that the remainder of the evaluation will be completed by another provider, this initial triage assessment does not replace that evaluation, and the importance of remaining in the ED until their evaluation is complete.      Rolan Bucco, MD 07/04/23 1049

## 2023-07-04 NOTE — ED Provider Notes (Addendum)
 [place] EMERGENCY DEPARTMENT AT [facility] Provider Note   CSN: [id number] Arrival date & time: 07/04/23  1033     History  Chief Complaint  Patient presents with   Headache   Hypertension    Angel Peck is a 28 y.o. male.  28 year old male presents today for concern of headache, and concern for elevated blood pressure.  He states he has been checking his blood pressure and most recently a few weeks ago and it was running with systolic of 120s.  His headache started 3 days ago and has not improved.  He states his mom recommended he come in for evaluation to the emergency department.  He states he does have history of headaches.  This is towards the front of his head and it is a dull pressure.  No vision change, balance issues.  The history is provided by the patient. No language interpreter was used.       Home Medications Prior to Admission medications   Medication Sig Start Date End Date Taking? Authorizing Provider  benzonatate (TESSALON) 100 MG capsule Take 1 capsule (100 mg total) by mouth every 8 (eight) hours. Patient not taking: Reported on 01/18/2023 01/08/19   Joy, Hillard Danker, PA-C  doxycycline (VIBRA-TABS) 100 MG tablet Take 1 tablet (100 mg total) by mouth 2 (two) times daily. Patient not taking: Reported on 01/18/2023 10/27/22   Darrick Grinder, PA-C      Allergies    Amoxil [amoxicillin] and Penicillins    Review of Systems   Review of Systems  Constitutional:  Negative for chills and fever.  Eyes:  Negative for photophobia and visual disturbance.  Neurological:  Positive for headaches. Negative for light-headedness.  All other systems reviewed and are negative.   Physical Exam Updated Vital Signs BP 131/86 (BP Location: Right Arm)   Pulse 70   Temp 97.6 F (36.4 C)   Resp 16   Ht 6\' 1"  (1.854 m)   Wt 95.3 kg   SpO2 100%   BMI 27.71 kg/m  Physical Exam Vitals and nursing note reviewed.  Constitutional:      General: He is not in acute  distress.    Appearance: Normal appearance. He is not ill-appearing.  HENT:     Head: Normocephalic and atraumatic.     Nose: Nose normal.  Eyes:     Extraocular Movements: Extraocular movements intact.     Conjunctiva/sclera: Conjunctivae normal.     Pupils: Pupils are equal, round, and reactive to light.  Cardiovascular:     Rate and Rhythm: Normal rate and regular rhythm.  Pulmonary:     Effort: Pulmonary effort is normal. No respiratory distress.  Musculoskeletal:        General: No deformity.  Skin:    Findings: No rash.  Neurological:     General: No focal deficit present.     Mental Status: He is alert and oriented to person, place, and time. Mental status is at baseline.     ED Results / Procedures / Treatments   Labs (all labs ordered are listed, but only abnormal results are displayed) Labs Reviewed  CBC WITH DIFFERENTIAL/PLATELET - Abnormal; Notable for the following components:      Result Value   WBC 3.3 (*)    Neutro Abs 1.4 (*)    All other components within normal limits  RESP PANEL BY RT-PCR (RSV, FLU A&B, COVID)  RVPGX2  BASIC METABOLIC PANEL    EKG EKG Interpretation Date/Time:  Wednesday  July 04 2023 10:47:05 EST Ventricular Rate:  72 PR Interval:  148 QRS Duration:  92 QT Interval:  368 QTC Calculation: 402 R Axis:   74  Text Interpretation: Normal sinus rhythm with sinus arrhythmia Nonspecific T wave abnormality Abnormal ECG When compared with ECG of 22-Feb-2023 20:49, No significant change since last tracing Confirmed by Meridee Score [phone]) on 07/04/2023 11:25:10 AM  Radiology No results found.  Procedures Procedures    Medications Ordered in ED Medications  prochlorperazine (COMPAZINE) injection 10 mg (10 mg Intramuscular Given 07/04/23 1135)  ketorolac (TORADOL) 15 MG/ML injection 15 mg (15 mg Intramuscular Given 07/04/23 1134)  diphenhydrAMINE (BENADRYL) capsule 25 mg (25 mg Oral Given 07/04/23 1135)    ED Course/ Medical Decision  Making/ A&P                                 Medical Decision Making Risk Prescription drug management.   28 year old male presents today for concern of headache and elevated blood pressure.  He has close normal sensation here.  The systolic of 120s he reported at home was not really hypertension.  Will treat headache.  Will reevaluate.  Reports feeling improved on reevaluation.  He is requesting a work note.  This is provided.  He is stable for discharge.  Discharged in stable condition.  Return precaution discussed.  CBC and BMP without acute concern.   Final Clinical Impression(s) / ED Diagnoses Final diagnoses:  Bad headache    Rx / DC Orders ED Discharge Orders     None         Marita Kansas, PA-C 07/04/23 1319    Marita Kansas, PA-C 07/04/23 1325    Terrilee Files, MD 07/04/23 1710

## 2023-07-04 NOTE — ED Triage Notes (Addendum)
 Pt. Stated, I'm here for high BP cause of the headaches I've had. Pt requesting a work note for yesterday because his ride didn't come but was out of work anyway.

## 2023-07-04 NOTE — Discharge Instructions (Addendum)
 Your exam today was reassuring.  Your headache improved after medications.  Follow-up with your primary care provider.  Take Tylenol and ibuprofen as you need to for headache.
